# Patient Record
Sex: Male | Born: 1940 | Race: White | Hispanic: No | Marital: Married | Smoking: Former smoker
Health system: Southern US, Community
[De-identification: ages and names within clinical notes are randomized; demographics above are authoritative.]

## PROBLEM LIST (undated history)

## (undated) DIAGNOSIS — F329 Major depressive disorder, single episode, unspecified: Secondary | ICD-10-CM

## (undated) DIAGNOSIS — E785 Hyperlipidemia, unspecified: Secondary | ICD-10-CM

## (undated) DIAGNOSIS — M1990 Unspecified osteoarthritis, unspecified site: Secondary | ICD-10-CM

## (undated) DIAGNOSIS — I10 Essential (primary) hypertension: Secondary | ICD-10-CM

## (undated) DIAGNOSIS — C801 Malignant (primary) neoplasm, unspecified: Secondary | ICD-10-CM

## (undated) DIAGNOSIS — F419 Anxiety disorder, unspecified: Secondary | ICD-10-CM

## (undated) DIAGNOSIS — F32A Depression, unspecified: Secondary | ICD-10-CM

## (undated) DIAGNOSIS — I2699 Other pulmonary embolism without acute cor pulmonale: Secondary | ICD-10-CM

## (undated) HISTORY — DX: Anxiety disorder, unspecified: F41.9

## (undated) HISTORY — DX: Essential (primary) hypertension: I10

## (undated) HISTORY — DX: Unspecified osteoarthritis, unspecified site: M19.90

## (undated) HISTORY — PX: AXILLARY LYMPH NODE BIOPSY: SHX5737

## (undated) HISTORY — PX: HERNIA REPAIR: SHX51

## (undated) HISTORY — DX: Hyperlipidemia, unspecified: E78.5

## (undated) HISTORY — DX: Depression, unspecified: F32.A

## (undated) HISTORY — DX: Malignant (primary) neoplasm, unspecified: C80.1

## (undated) HISTORY — PX: PROSTATE SURGERY: SHX751

## (undated) HISTORY — PX: VASECTOMY: SHX75

## (undated) HISTORY — PX: MELANOMA EXCISION: SHX5266

## (undated) HISTORY — PX: APPENDECTOMY: SHX54

## (undated) HISTORY — DX: Major depressive disorder, single episode, unspecified: F32.9

## (undated) HISTORY — PX: SUPERFICIAL LYMPH NODE BIOPSY / EXCISION: SUR127

## (undated) HISTORY — PX: LUMBAR SPINE SURGERY: SHX701

## (undated) HISTORY — DX: Other pulmonary embolism without acute cor pulmonale: I26.99

---

## 2017-02-22 HISTORY — PX: VEIN SURGERY: SHX48

## 2017-12-30 ENCOUNTER — Encounter: Payer: Self-pay | Admitting: Neurology

## 2018-01-30 ENCOUNTER — Ambulatory Visit (INDEPENDENT_AMBULATORY_CARE_PROVIDER_SITE_OTHER): Payer: Medicare Other | Admitting: Physician Assistant

## 2018-01-30 ENCOUNTER — Encounter: Payer: Self-pay | Admitting: Physician Assistant

## 2018-01-30 ENCOUNTER — Ambulatory Visit (INDEPENDENT_AMBULATORY_CARE_PROVIDER_SITE_OTHER): Payer: Medicare Other

## 2018-01-30 VITALS — BP 145/85 | HR 66 | Temp 98.2°F | Ht 75.0 in | Wt 202.0 lb

## 2018-01-30 DIAGNOSIS — M25511 Pain in right shoulder: Secondary | ICD-10-CM | POA: Diagnosis not present

## 2018-01-30 DIAGNOSIS — C7A8 Other malignant neuroendocrine tumors: Secondary | ICD-10-CM

## 2018-01-30 DIAGNOSIS — G8929 Other chronic pain: Secondary | ICD-10-CM | POA: Diagnosis not present

## 2018-01-30 DIAGNOSIS — M47816 Spondylosis without myelopathy or radiculopathy, lumbar region: Secondary | ICD-10-CM | POA: Diagnosis not present

## 2018-01-30 DIAGNOSIS — F0280 Dementia in other diseases classified elsewhere without behavioral disturbance: Secondary | ICD-10-CM | POA: Insufficient documentation

## 2018-01-30 DIAGNOSIS — G20A1 Parkinson's disease without dyskinesia, without mention of fluctuations: Secondary | ICD-10-CM

## 2018-01-30 DIAGNOSIS — M25512 Pain in left shoulder: Secondary | ICD-10-CM

## 2018-01-30 DIAGNOSIS — G309 Alzheimer's disease, unspecified: Secondary | ICD-10-CM

## 2018-01-30 DIAGNOSIS — C7B8 Other secondary neuroendocrine tumors: Secondary | ICD-10-CM

## 2018-01-30 DIAGNOSIS — Z7689 Persons encountering health services in other specified circumstances: Secondary | ICD-10-CM

## 2018-01-30 DIAGNOSIS — Z111 Encounter for screening for respiratory tuberculosis: Secondary | ICD-10-CM

## 2018-01-30 DIAGNOSIS — M160 Bilateral primary osteoarthritis of hip: Secondary | ICD-10-CM | POA: Insufficient documentation

## 2018-01-30 DIAGNOSIS — G20 Parkinson's disease: Secondary | ICD-10-CM | POA: Insufficient documentation

## 2018-01-30 DIAGNOSIS — I83813 Varicose veins of bilateral lower extremities with pain: Secondary | ICD-10-CM | POA: Insufficient documentation

## 2018-01-30 DIAGNOSIS — M170 Bilateral primary osteoarthritis of knee: Secondary | ICD-10-CM | POA: Insufficient documentation

## 2018-01-30 DIAGNOSIS — Z0279 Encounter for issue of other medical certificate: Secondary | ICD-10-CM

## 2018-01-30 DIAGNOSIS — N182 Chronic kidney disease, stage 2 (mild): Secondary | ICD-10-CM

## 2018-01-30 DIAGNOSIS — G20C Parkinsonism, unspecified: Secondary | ICD-10-CM | POA: Insufficient documentation

## 2018-01-30 DIAGNOSIS — I10 Essential (primary) hypertension: Secondary | ICD-10-CM

## 2018-01-30 DIAGNOSIS — F331 Major depressive disorder, recurrent, moderate: Secondary | ICD-10-CM | POA: Diagnosis not present

## 2018-01-30 NOTE — Progress Notes (Signed)
Subjective:    CC: Multiple aches  HPI: This is a pleasant 77 year old male with a history of metastatic neuroendocrine cancer.  He is here to establish care with 1 of my partners, I am called to consult on multiple joint aches, the worst of which is his left knee.  Pain is moderate, persistent, localized to the medial joint line with radiation into the mid tibia, no mechanical symptoms, no trauma.  He also has pain in both hips, his lumbar spine (post multilevel laminectomy), both shoulders.  Have significant weakness in his legs.  Worsening. Per family report he also has a resting tremor occasionally in his hands.  I reviewed the past medical history, family history, social history, surgical history, and allergies today and no changes were needed.  Please see the problem list section below in epic for further details.  Past Medical History: Past Medical History:  Diagnosis Date  . Anxiety   . Arthritis   . Cancer ([place])   . Depression   . Hyperlipidemia   . Hypertension   . Pulmonary embolism [facility])    Past Surgical History: Past Surgical History:  Procedure Laterality Date  . APPENDECTOMY    . AXILLARY LYMPH NODE BIOPSY    . HERNIA REPAIR    . LUMBAR SPINE SURGERY    . MELANOMA EXCISION Left    left arm  . PROSTATE SURGERY    . SUPERFICIAL LYMPH NODE BIOPSY / EXCISION    . VASECTOMY    . VEIN SURGERY Bilateral 2019   Social History: Social History   Socioeconomic History  . Marital status: Married    Spouse name: Not on file  . Number of children: Not on file  . Years of education: Not on file  . Highest education level: Not on file  Occupational History  . Not on file  Social Needs  . Financial resource strain: Not on file  . Food insecurity:    Worry: Not on file    Inability: Not on file  . Transportation needs:    Medical: Not on file    Non-medical: Not on file  Tobacco Use  . Smoking status: Former Research scientist (life sciences)  . Smokeless tobacco: Never Used  Substance and  Sexual Activity  . Alcohol use: Not Currently  . Drug use: Never  . Sexual activity: Not Currently  Lifestyle  . Physical activity:    Days per week: Not on file    Minutes per session: Not on file  . Stress: Not on file  Relationships  . Social connections:    Talks on phone: Not on file    Gets together: Not on file    Attends religious service: Not on file    Active member of club or organization: Not on file    Attends meetings of clubs or organizations: Not on file    Relationship status: Not on file  Other Topics Concern  . Not on file  Social History Narrative  . Not on file   Family History: Family History  Family history unknown: Yes   Allergies: No Known Allergies Medications: See med rec.  Review of Systems: No fevers, chills, night sweats, weight loss, chest pain, or shortness of breath.   Objective:    General: Well Developed, well nourished, and in no acute distress.  Neuro: Alert and oriented x3, extra-ocular muscles intact, sensation grossly intact.  Shuffling gait, masklike face, he does not swing his arms when walking.  Per family report he also has a  resting tremor occasionally in his hands. HEENT: Normocephalic, atraumatic, pupils equal round reactive to light, neck supple, no masses, no lymphadenopathy, thyroid nonpalpable.  Skin: Warm and dry, no rashes. Cardiac: Regular rate and rhythm, no murmurs rubs or gallops, no lower extremity edema.  Respiratory: Clear to auscultation bilaterally. Not using accessory muscles, speaking in full sentences. Left knee: Normal to inspection with no erythema or effusion or obvious bony abnormalities. Tender to palpation of the medial joint line ROM normal in flexion and extension and lower leg rotation. Ligaments with solid consistent endpoints including ACL, PCL, LCL, MCL. Negative Mcmurray's and provocative meniscal tests. Non painful patellar compression. Patellar and quadriceps tendons unremarkable. Hamstring  and quadriceps strength is normal.  Procedure: Real-time Ultrasound Guided Injection of left knee Device: GE Logiq E  Verbal informed consent obtained.  Time-out conducted.  Noted no overlying erythema, induration, or other signs of local infection.  Skin prepped in a sterile fashion.  Local anesthesia: Topical Ethyl chloride.  With sterile technique and under real time ultrasound guidance: 1 cc Kenalog 40, 2 cc lidocaine, 2 cc bupivacaine injected easily. Completed without difficulty  Pain immediately resolved suggesting accurate placement of the medication.  Advised to call if fevers/chills, erythema, induration, drainage, or persistent bleeding.  Images permanently stored and available for review in the ultrasound unit.  Impression: Technically successful ultrasound guided injection.  Impression and Recommendations:    Lumbar spondylosis With progressive weakness, history of multilevel lumbar laminectomy. Thoracic and lumbar spine x-rays. Lumbar spine MRI without contrast for now. He does have a history of metastatic neuroendocrine cancer.  Bilateral shoulder pain Bilateral x-rays. We will further evaluate this at a future visit.  Primary osteoarthritis of both knees Bilateral knee x-rays. Left knee injection as above, return in a month.  Primary osteoarthritis of both hips X-rays today, I do expect some systemic effect from the knee injection. We will target 1 joint at a time.  Parkinsonism ([place]) Classic Parkinson's gait, masklike face, lack of swinging arms, shuffling gait. Referral to neurology.  I spent 40 minutes with this patient, greater than 50% was face-to-face time counseling regarding the above diagnoses, specifically coordinating the multiple aspects of his care for his multiple orthopedic diagnoses and neurologic diagnoses ___________________________________________ Gwen Her. Dianah Field, M.D., ABFM., CAQSM. Primary Care and Sports Medicine [place]  [facility]  Adjunct Professor of [place] [employer]

## 2018-01-30 NOTE — Assessment & Plan Note (Signed)
Bilateral knee x-rays. Left knee injection as above, return in a month.

## 2018-01-30 NOTE — Assessment & Plan Note (Signed)
With progressive weakness, history of multilevel lumbar laminectomy. Thoracic and lumbar spine x-rays. Lumbar spine MRI without contrast for now. He does have a history of metastatic neuroendocrine cancer.

## 2018-01-30 NOTE — Progress Notes (Signed)
HPI:                                                                Andre Sharp is a 77 y.o. male who presents to [facility]: Primary Care Sports Medicine today to establish care  Current concerns:   He is transferring his care from [facility]. History is provided by the patient, his son and his daughter-in-law.  He is currently residing with son and in-laws, who are requesting FL2 form for placement to a memory care center.   Alzheimer's disease: recently evaluated at [facility] ED on 01/29/18 for AMS. CT head was negative for acute intracranial abnormality; showed chronic small vessel disease and mild atrophy. CXR, Ammonia, CMP and CBC were unremarkable. He was treated empirically for acute cystitis with Keflex; urine cx pending. Family reports he is back to baseline. He is oriented to person, place, time. Tends to wander and becomes a little agitated at times. Family is requesting placement to memory care. Currently taking Donepezil 5 mg daily. He also takes Trazodone 50 mg and Lexapro 10 mg for depression/sleep difficulties.  Neuroendocrine tumor: he was followed by Dr. Lily Peer at [facility]. Family has copies of some records and imaging studies showing metastatic disease to the lung, liver, lymph nodes and thoracic spine. Requesting referral to hem/onc.  History of DVT/PE:  Reports he was originally on Eliquis twice per day and is now only taking it once per day. Family reports they give him Ibuprofen every once in awhile for headache  His other main concern is bilateral lower extremity pain. Reports pain in his thighs and medial calves, pain is constant, moderate, persistent. He has had varicose vein surgery in [place] (Dr. Elnoria Howard, vascular [place]) in the recent past, but states this was not helpful in alleviating his pain. He currently takes Tylenol without relief.   Depression screen PHQ 2/9 01/30/2018  Decreased Interest 2  Down, Depressed,  Hopeless 2  PHQ - 2 Score 4  Altered sleeping 3  Tired, decreased energy 1  Change in appetite 0  Feeling bad or failure about yourself  1  Trouble concentrating 3  Moving slowly or fidgety/restless 2  Suicidal thoughts 0  PHQ-9 Score 14  Difficult doing work/chores Not difficult at all    GAD 7 : Generalized Anxiety Score 01/30/2018  Nervous, Anxious, on Edge 1  Control/stop worrying 0  Worry too much - different things 0  Trouble relaxing 2  Restless 3  Easily annoyed or irritable 1  Afraid - awful might happen 0  Total GAD 7 Score 7  Anxiety Difficulty Not difficult at all    Fall Risk  01/30/2018  Falls in the past year? 1  Number falls in past yr: 1  Injury with Fall? 1  Risk for fall due to : History of fall(s);Impaired balance/gait;Mental status change  Follow up Falls prevention discussed   No flowsheet data found.   Past Medical History:  Diagnosis Date  . Anxiety   . Arthritis   . Cancer ([place])   . Depression   . Hyperlipidemia   . Hypertension   . Pulmonary embolism [facility])    Past Surgical History:  Procedure Laterality Date  . APPENDECTOMY    . AXILLARY  LYMPH NODE BIOPSY    . HERNIA REPAIR    . LUMBAR SPINE SURGERY    . MELANOMA EXCISION Left    left arm  . PROSTATE SURGERY    . SUPERFICIAL LYMPH NODE BIOPSY / EXCISION    . VASECTOMY    . VEIN SURGERY Bilateral 2019   Social History   Tobacco Use  . Smoking status: Former Research scientist (life sciences)  . Smokeless tobacco: Never Used  Substance Use Topics  . Alcohol use: Not Currently   Family history is unknown by patient.    ROS: Review of Systems  Constitutional: Positive for fever.  Respiratory: Positive for cough.   Genitourinary:       + recurrent UTI  Musculoskeletal: Positive for back pain, falls, joint pain and myalgias.  Neurological: Positive for weakness and headaches.  Endo/Heme/Allergies: Bruises/bleeds easily.     Medications: Current Outpatient Medications  Medication Sig Dispense  Refill  . acetaminophen (TYLENOL) 500 MG tablet Take 500 mg by mouth every 6 (six) hours as needed for headache.    . Apoaequorin 10 MG CAPS Take by mouth.    . cephALEXin (KEFLEX) 500 MG capsule Take 500 mg by mouth 2 (two) times daily.    . diclofenac sodium (VOLTAREN) 1 % GEL Apply 1 application topically 2 (two) times daily as needed.    . docusate sodium (COLACE) 100 MG capsule Take 100 mg by mouth daily as needed for constipation.    . Inulin (FIBER CHOICE PO) Take by mouth.    . metoprolol tartrate (LOPRESSOR) 25 MG tablet Take 25 mg by mouth 2 (two) times daily.    Marland Kitchen octreotide (SANDOSTATIN LAR) 10 MG injection Inject 10 mg into the muscle every 28 (twenty-eight) days.    . Probiotic Product (PROBIOTIC-10) CAPS Take by mouth.    . Vitamins C E (CRANBERRY URINARY COMFORT PO) Take by mouth.    Marland Kitchen amLODipine (NORVASC) 5 MG tablet Take 5 mg by mouth daily.    Marland Kitchen apixaban (ELIQUIS) 5 MG TABS tablet Take 1 tablet (5 mg total) by mouth daily. 90 tablet 0  . diazepam (VALIUM) 5 MG tablet 1 tab 2 hours before procedure or imaging, take another tab 30 minutes to 1 hour before if not feeling relaxed 2 tablet 0  . donepezil (ARICEPT) 5 MG tablet Take 5 mg by mouth at bedtime.    Marland Kitchen escitalopram (LEXAPRO) 10 MG tablet Take 10 mg by mouth daily.    Marland Kitchen ibuprofen (ADVIL,MOTRIN) 600 MG tablet Take 600 mg by mouth every 8 (eight) hours as needed.    Marland Kitchen lisinopril (PRINIVIL,ZESTRIL) 20 MG tablet Take 20 mg by mouth daily.    . Melatonin 1 MG SUBL Place 1 mg under the tongue at bedtime as needed.    Marland Kitchen omeprazole (PRILOSEC) 20 MG capsule Take 20 mg by mouth daily.    . traZODone (DESYREL) 50 MG tablet Take 50 mg by mouth at bedtime as needed.    . trimethoprim (TRIMPEX) 100 MG tablet Take 100 mg by mouth daily.     No current facility-administered medications for this visit.    No Known Allergies     Objective:  BP (!) 145/85   Pulse 66   Temp 98.2 F (36.8 C) (Oral)   Ht 6\' 3"  (1.905 m)   Wt 202  lb (91.6 kg)   SpO2 97%   BMI 25.25 kg/m  Gen:  alert, not ill-appearing, no distress, appropriate for age HEENT: head normocephalic without obvious abnormality, conjunctiva  and cornea clear, trachea midline Pulm: Normal work of breathing, normal phonation, clear to auscultation bilaterally, no wheezes, rales or rhonchi CV: Normal rate, regular rhythm, s1 and s2 distinct, no murmurs, clicks or rubs  Neuro: alert and oriented x 3, unable to provide history regarding his visit to the ED yesterday, no tremor MSK: extremities atraumatic, slow, shuffling gait and station Skin: intact, no rashes on exposed skin, no jaundice, no cyanosis, varicosities of bilateral lower extremities Psych: well-groomed, cooperative, good eye contact, depressed mood, flat affect is mood-congruent, speech is articulate  Acute Interface, Incoming Rad Results - 01/29/2018  9:16 PM EST  INDICATION:AMS  TECHNIQUE: CT HEAD WITHOUT IV CONTRAST  FINDINGS:   Study is limited due to motion artifact.  There is no midline shift, intracranial hemorrhage, or acute mass effect. No abnormal extra-axial fluid collections are seen. The ventricles, cisterns, and sulci are mildly enlarged.  There is mild to moderate  atrophy of the bilateral cerebellar hemispheres.  Negative for hydrocephalus.  Moderate periventricular deep white matter hypodensity is identified.  The visualized orbits are unremarkable. The visualized paranasal sinuses are clear. The mastoid air cells are clear.  No displaced or depressed calvarial fracture is identified.   IMPRESSION:   1.  No acute intracranial abnormality is identified. 2.  Mild cerebral atrophy. 3.  Moderate small vessel ischemic disease changes.  Electronically Signed by: [place] Everywhere 01/29/2018 WBC 8.4 HGB 14.8 Plt 208  Glucose 136 Na 138  K 4.1 BUN 19 Scr 1.01 GFR 83    Assessment and Plan: 77 y.o. male with   .[place] was seen today for  establish care.  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 [facility]) -     Ambulatory referral to Hematology / Oncology  Varicose veins of bilateral lower extremities with pain  Encounter for PPD test -     TB Skin Test  Encounter for issuance of medical certificate  Primary osteoarthritis of both knees -     DG Knee Complete 4 Views Left; Future -     DG Knee Complete 4 Views Right; Future  Chronic pain of both shoulders -     DG Shoulder Left; Future -     DG Shoulder Right; Future  Lumbar spondylosis -     DG Thoracic Spine W/Swimmers -     DG Lumbar Spine Complete; Future -     MR LUMBAR SPINE WO CONTRAST; Future  Primary osteoarthritis of both hips -     DG HIP UNILAT W OR W/O PELVIS 2-3 VIEWS LEFT -     DG HIP UNILAT W OR W/O PELVIS 2-3 VIEWS RIGHT   - Personally reviewed PMH, PSH, PFH, medications, allergies, HM - Personally reviewed labs and imaging in Care Everywhere from 01/29/18, findings noted above - Influenza UTD - Tdap deferred - Pneumococcal unknown - PHQ2 positive, PHQ9=14, no acute safety issues, cont current medications - FL2 form completed for placement to memory care  - PPD test placed today, return in 48 hours for PPD read - Sports Medicine was consulted today for lower extremity pain (see A&P note) - During consultation, there was concern for possible underlying Parkinson's disease. Patient was referred to Neurology   Patient  education and anticipatory guidance given Patient agrees with treatment plan Follow-up in 2 days for PPD read as needed if symptoms worsen or fail to improve  Darlyne Russian PA-C

## 2018-01-30 NOTE — Assessment & Plan Note (Signed)
X-rays today, I do expect some systemic effect from the knee injection. We will target 1 joint at a time.

## 2018-01-30 NOTE — Assessment & Plan Note (Signed)
Classic Parkinson's gait, masklike face, lack of swinging arms, shuffling gait. Referral to neurology.

## 2018-01-30 NOTE — Assessment & Plan Note (Signed)
Bilateral x-rays. We will further evaluate this at a future visit.

## 2018-01-31 ENCOUNTER — Telehealth: Payer: Self-pay | Admitting: Sports Medicine

## 2018-01-31 ENCOUNTER — Telehealth: Payer: Self-pay

## 2018-01-31 ENCOUNTER — Encounter: Payer: Self-pay | Admitting: Physician Assistant

## 2018-01-31 DIAGNOSIS — F331 Major depressive disorder, recurrent, moderate: Secondary | ICD-10-CM | POA: Insufficient documentation

## 2018-01-31 DIAGNOSIS — Z85820 Personal history of malignant melanoma of skin: Secondary | ICD-10-CM | POA: Insufficient documentation

## 2018-01-31 DIAGNOSIS — Z8546 Personal history of malignant neoplasm of prostate: Secondary | ICD-10-CM | POA: Insufficient documentation

## 2018-01-31 MED ORDER — DIAZEPAM 5 MG PO TABS: ORAL_TABLET | ORAL | 0 refills | Status: DC

## 2018-01-31 NOTE — Telephone Encounter (Signed)
Pt needs premeds prior to MRI for claustrophobia. Routing.

## 2018-01-31 NOTE — Telephone Encounter (Signed)
Pt son left a vm asking for a letter stated that his father is not competent enough to make his own decisions.  He is wanting to meet with some lawyers and they are needing this before they schedule an appointment with him. -EH/RMA

## 2018-01-31 NOTE — Telephone Encounter (Signed)
[facility] called in

## 2018-01-31 NOTE — Telephone Encounter (Signed)
Pt's son advised.

## 2018-02-01 ENCOUNTER — Encounter: Payer: Self-pay | Admitting: Physician Assistant

## 2018-02-01 ENCOUNTER — Ambulatory Visit (INDEPENDENT_AMBULATORY_CARE_PROVIDER_SITE_OTHER): Payer: Medicare Other | Admitting: Physician Assistant

## 2018-02-01 ENCOUNTER — Other Ambulatory Visit: Payer: Self-pay

## 2018-02-01 VITALS — Temp 97.9°F

## 2018-02-01 DIAGNOSIS — Z111 Encounter for screening for respiratory tuberculosis: Secondary | ICD-10-CM

## 2018-02-01 LAB — TB SKIN TEST
INDURATION: 0 mm
TB Skin Test: NEGATIVE

## 2018-02-01 MED ORDER — APIXABAN 5 MG PO TABS: 5.0000 mg | ORAL_TABLET | Freq: Every day | ORAL | 0 refills | Status: AC

## 2018-02-01 NOTE — Telephone Encounter (Signed)
Signed letter in your inbox along with FL2 form. Please just fill in prescription medications to the [facility]

## 2018-02-01 NOTE — Progress Notes (Signed)
  Oncology Nurse Navigator Documentation  Called patient's daughter-in-law  to advise of initial consult on 02/06/18 @ 2 PM with Dr. Benay Spice.

## 2018-02-01 NOTE — Progress Notes (Signed)
Pt came into clinic for PPD read. Did have some bruising at injection site, no induration. Negative results entered and printed. Results provided to Pt's family. No further questions.

## 2018-02-01 NOTE — Telephone Encounter (Signed)
Letter given to son during his visit today -EH/RMA

## 2018-02-05 ENCOUNTER — Encounter: Payer: Self-pay | Admitting: Physician Assistant

## 2018-02-05 DIAGNOSIS — I10 Essential (primary) hypertension: Secondary | ICD-10-CM | POA: Insufficient documentation

## 2018-02-05 DIAGNOSIS — N182 Chronic kidney disease, stage 2 (mild): Secondary | ICD-10-CM | POA: Insufficient documentation

## 2018-02-06 ENCOUNTER — Ambulatory Visit (INDEPENDENT_AMBULATORY_CARE_PROVIDER_SITE_OTHER): Payer: Medicare Other

## 2018-02-06 ENCOUNTER — Ambulatory Visit: Payer: Medicare Other | Admitting: Oncology

## 2018-02-06 DIAGNOSIS — M47816 Spondylosis without myelopathy or radiculopathy, lumbar region: Secondary | ICD-10-CM | POA: Diagnosis not present

## 2018-02-07 ENCOUNTER — Other Ambulatory Visit: Payer: Self-pay | Admitting: Physician Assistant

## 2018-02-07 ENCOUNTER — Other Ambulatory Visit: Payer: Self-pay

## 2018-02-07 ENCOUNTER — Telehealth: Payer: Self-pay | Admitting: Physician Assistant

## 2018-02-07 MED ORDER — AMBULATORY NON FORMULARY MEDICATION: 0 refills | Status: DC

## 2018-02-07 MED ORDER — CYANOCOBALAMIN 2500 MCG PO CHEW: 5000.0000 ug | CHEWABLE_TABLET | Freq: Every day | ORAL | 3 refills | Status: AC

## 2018-02-07 MED ORDER — PHENAZOPYRIDINE HCL 95 MG PO TABS: 95.0000 mg | ORAL_TABLET | Freq: Three times a day (TID) | ORAL | 2 refills | Status: AC | PRN

## 2018-02-07 MED ORDER — ACETAMINOPHEN 500 MG PO TABS: 500.0000 mg | ORAL_TABLET | Freq: Four times a day (QID) | ORAL | Status: AC | PRN

## 2018-02-07 MED ORDER — AMBULATORY NON FORMULARY MEDICATION: 0 refills | Status: AC

## 2018-02-07 MED ORDER — MELATONIN 1 MG SL SUBL: 5.0000 mg | SUBLINGUAL_TABLET | Freq: Every evening | SUBLINGUAL | Status: AC | PRN

## 2018-02-07 MED ORDER — GLYCERIN (ADULT) 2 G RE SUPP: 1.0000 | RECTAL | 0 refills | Status: AC | PRN

## 2018-02-07 MED ORDER — APOAEQUORIN 20 MG PO CAPS: 20.0000 mg | ORAL_CAPSULE | Freq: Every day | ORAL | Status: AC

## 2018-02-07 MED ORDER — TRIMETHOPRIM 100 MG PO TABS: 100.0000 mg | ORAL_TABLET | Freq: Every day | ORAL | 3 refills | Status: AC

## 2018-02-07 NOTE — Telephone Encounter (Signed)
Andre Sharp spoke with daughter in-law

## 2018-02-07 NOTE — Telephone Encounter (Signed)
Received fax from [facility] I signed all of the standing orders They are asking for medication clarification It is unclear if they are stating they want the patient to d/c these meds or if they are meds he reports no longer taking. Can you perform the med rec and update both our EMR and their form? Specifically they are questioning the following Trimethoprim (antibiotic - this may have something to do with hx of prostate cancer or recurrent UTI) Octreotide (cancer medication that will be administered at the cancer center, keep on our list)  Please fax once med rec is complete

## 2018-02-27 ENCOUNTER — Ambulatory Visit: Payer: Self-pay | Admitting: Sports Medicine

## 2018-03-02 ENCOUNTER — Ambulatory Visit (INDEPENDENT_AMBULATORY_CARE_PROVIDER_SITE_OTHER): Payer: Medicare Other | Admitting: Sports Medicine

## 2018-03-02 ENCOUNTER — Encounter: Payer: Self-pay | Admitting: Sports Medicine

## 2018-03-02 DIAGNOSIS — M47816 Spondylosis without myelopathy or radiculopathy, lumbar region: Secondary | ICD-10-CM | POA: Diagnosis not present

## 2018-03-02 MED ORDER — TRAMADOL HCL 50 MG PO TABS: 50.0000 mg | ORAL_TABLET | Freq: Three times a day (TID) | ORAL | 0 refills | Status: DC | PRN

## 2018-03-02 MED ORDER — GABAPENTIN 300 MG PO CAPS: ORAL_CAPSULE | ORAL | 3 refills | Status: DC

## 2018-03-02 MED ORDER — GABAPENTIN 300 MG PO CAPS: ORAL_CAPSULE | ORAL | 3 refills | Status: AC

## 2018-03-02 MED ORDER — TRAMADOL HCL 50 MG PO TABS: 50.0000 mg | ORAL_TABLET | Freq: Three times a day (TID) | ORAL | 0 refills | Status: AC | PRN

## 2018-03-02 NOTE — Progress Notes (Signed)
Subjective:    CC: Recheck multiple aches and pains  HPI: [place] is a pleasant 78 year old male, we treated him for knee osteoarthritis at the last visit, we injected his knee, he may have had some improvement but it is tough to tell as he has difficulty remembering things.  He has had somewhat of a decrease in his mentation recently.  He has not yet seen his PCP for this.  He does have significant back pain, has had a multilevel lumbar decompression, hip arthritis, knee arthritis and shoulder osteoarthritis in multiple locations.  They understand that rather than injecting multiple structures it might be the best to try some medications.  His back pain is axial, with radiation down into the back of both legs.  No bowel or bladder dysfunction, saddle numbness, no constitutional signs.  I reviewed the past medical history, family history, social history, surgical history, and allergies today and no changes were needed.  Please see the problem list section below in epic for further details.  Past Medical History: Past Medical History:  Diagnosis Date  . Anxiety   . Arthritis   . Cancer ([place])   . Depression   . Hyperlipidemia   . Hypertension   . Pulmonary embolism [facility])    Past Surgical History: Past Surgical History:  Procedure Laterality Date  . APPENDECTOMY    . AXILLARY LYMPH NODE BIOPSY    . HERNIA REPAIR    . LUMBAR SPINE SURGERY    . MELANOMA EXCISION Left    left arm  . PROSTATE SURGERY    . SUPERFICIAL LYMPH NODE BIOPSY / EXCISION    . VASECTOMY    . VEIN SURGERY Bilateral 2019   Social History: Social History   Socioeconomic History  . Marital status: Married    Spouse name: Not on file  . Number of children: Not on file  . Years of education: Not on file  . Highest education level: Not on file  Occupational History  . Not on file  Social Needs  . Financial resource strain: Not on file  . Food insecurity:    Worry: Not on file    Inability: Not on file  .  Transportation needs:    Medical: Not on file    Non-medical: Not on file  Tobacco Use  . Smoking status: Former Research scientist (life sciences)  . Smokeless tobacco: Never Used  Substance and Sexual Activity  . Alcohol use: Not Currently  . Drug use: Never  . Sexual activity: Not Currently  Lifestyle  . Physical activity:    Days per week: Not on file    Minutes per session: Not on file  . Stress: Not on file  Relationships  . Social connections:    Talks on phone: Not on file    Gets together: Not on file    Attends religious service: Not on file    Active member of club or organization: Not on file    Attends meetings of clubs or organizations: Not on file    Relationship status: Not on file  Other Topics Concern  . Not on file  Social History Narrative  . Not on file   Family History: Family History  Family history unknown: Yes   Allergies: No Known Allergies Medications: See med rec.  Review of Systems: No fevers, chills, night sweats, weight loss, chest pain, or shortness of breath.   Objective:    General: Well Developed, well nourished, and in no acute distress.  Neuro: Alert and oriented  x3, extra-ocular muscles intact, sensation grossly intact.  HEENT: Normocephalic, atraumatic, pupils equal round reactive to light, neck supple, no masses, no lymphadenopathy, thyroid nonpalpable.  Skin: Warm and dry, no rashes. Cardiac: Regular rate and rhythm, no murmurs rubs or gallops, no lower extremity edema.  Respiratory: Clear to auscultation bilaterally. Not using accessory muscles, speaking in full sentences.  Impression and Recommendations:    Lumbar spondylosis [place] does have significant facet arthritis in his spine, no discrete areas of neuroforaminal stenosis on the MRI. Knee is still hurting, his shoulders and hips also still hurt. We are going to start medical pain management. He has not responded to epidurals in the past. No NSAIDs, on Eliquis. Gabapentin in a slow up taper,  tramadol for breakthrough pain. Home health physical therapy at his assisted living facility, return to see me as needed.  ___________________________________________ Gwen Her. Dianah Field, M.D., ABFM., CAQSM. Primary Care and Sports Medicine [place] [facility]  Adjunct Professor of [place] [employer]

## 2018-03-02 NOTE — Assessment & Plan Note (Signed)
Andre Sharp does have significant facet arthritis in his spine, no discrete areas of neuroforaminal stenosis on the MRI. Knee is still hurting, his shoulders and hips also still hurt. We are going to start medical pain management. He has not responded to epidurals in the past. No NSAIDs, on Eliquis. Gabapentin in a slow up taper, tramadol for breakthrough pain. Home health physical therapy at his assisted living facility, return to see me as needed.

## 2018-03-17 ENCOUNTER — Telehealth: Payer: Self-pay

## 2018-03-17 NOTE — Telephone Encounter (Signed)
Left message for a return call

## 2018-03-17 NOTE — Telephone Encounter (Signed)
Brittney with Home Health called and states she needed to report out of range vitals.   Neck pain 8-9-and 10 at times Pulse 109

## 2018-03-17 NOTE — Telephone Encounter (Signed)
Noted.  Recommend Ibuprofen 600 mg every 6 hours as needed for pain Can also take Tramadol 50 mg every 6 hours as needed for severe pain Continue Gabapentin titration Apply ice or heat (whichever feels better) for 20-30 mins every 3-4 hours as needed Keep follow-up with Dr. Darene Lamer Follow-up sooner for upper extremity weakness, new numbness/tingling, fever/chills, worsening symptoms

## 2018-03-20 ENCOUNTER — Encounter: Payer: Self-pay | Admitting: Neurology

## 2018-03-20 ENCOUNTER — Ambulatory Visit (INDEPENDENT_AMBULATORY_CARE_PROVIDER_SITE_OTHER): Payer: Medicare Other | Admitting: Neurology

## 2018-03-20 VITALS — BP 137/72 | HR 108

## 2018-03-20 DIAGNOSIS — Z7409 Other reduced mobility: Secondary | ICD-10-CM | POA: Diagnosis not present

## 2018-03-20 DIAGNOSIS — R4182 Altered mental status, unspecified: Secondary | ICD-10-CM

## 2018-03-20 NOTE — Progress Notes (Signed)
Subjective:    Patient ID: Andre Sharp is a 78 y.o. male.  HPI     Andre Age, MD, PhD [facility] [address] [place], [place] [address]  Dear Dr. Dianah Field,   I saw your patient, Andre Sharp, upon your kind request in my neurologic clinic today for initial consultation for concern for parkinsonism. The Patient is accompanied by his son and daughter-in-law today. As you know, Andre Sharp is a 78 year old right-handed gentleman with an underlying medical history of arthritis affecting multiple major joint areas of lower back, history of cancer, anxiety, depression, hypertension, hyperlipidemia, pulmonary embolism, neuroendocrine tumors/carcinoid, and memory loss, who reports very little today. He is essentially nonverbal and unable to answer any questions appropriately. His daughter-in-law provides most of the history. He moved from [place] about a month ago. He is currently in a memory care facility in [place], [place]. According to the son and daughter-in-law he was able to walk fairly independently up until about 3-1/2 months ago. He was also driving at the time but he was not doing very well in terms of nutrition and dated today care.  I reviewed your office note from 03/02/2018. Of note, he had a brain MRI without contrast at an outside facility in [place] through his oncologist on 03/01/2017 and I reviewed the scanned report: Impression: Stable examination. No evidence for acute hemorrhage, acute infarction or hydrocephalus. No acute intracranial abnormalities. Sharp-appropriate involutional changes. Of note, he was hospitalized at [facility] in December 2019. He had a head CT without contrast on 02/10/2018 and I reviewed the results: No acute traumatic intracranial abnormality.     2.  Foci of Sharp-indeterminate lacunar infarcts within the bilateral basal ganglia and right frontal lobe corona radiata/centrum semiovale.  MRI could further assess for acute infarct if clinically indicated. 3.  Background of presumed advanced chronic microvascular ischemic changes in the cerebral white matter. He had a head CT without contrast on 02/13/2018 and I reviewed the results: 1. No acute intracranial normality. 2. Prior bilateral basal ganglia perforator infarcts. 3. Background of chronic ischemic microvascular disease.    He had a brain MRI without contrast on 12/30/2017 and I reviewed the results. Impression: Stable exam compared to 03/01/2017 with supratentorial T2 flair hyperintensities which are nonspecific but most commonly related to chronic microvascular ischemic angiopathy. Limited examination for intracranial metastatic disease on noncontrast exam. There is no new or suspicious T2 flair hyperintense signal compared to prior. He apparently also had a PET/CT in May 2019 but the results are not available for me today.  He has had some evidence of hallucinations and delusions, he has been picking at his skin, he has been treated for a recent urinary tract infection.  His Past Medical History Is Significant For: Past Medical History:  Diagnosis Date  . Anxiety   . Arthritis   . Cancer ([place])   . Depression   . Hyperlipidemia   . Hypertension   . Pulmonary embolism ([place])     His Past Surgical History Is Significant For: Past Surgical History:  Procedure Laterality Date  . APPENDECTOMY    . AXILLARY LYMPH NODE BIOPSY    . HERNIA REPAIR    . LUMBAR SPINE SURGERY    . MELANOMA EXCISION Left    left arm  . PROSTATE SURGERY    . SUPERFICIAL LYMPH NODE BIOPSY / EXCISION    . VASECTOMY    . VEIN SURGERY Bilateral 2019  His Family History Is Significant For: Family History  Family history unknown: Yes    His Social History Is Significant For: Social History   Socioeconomic History  . Marital status: Married    Spouse name: Not on file  . Number of children: Not on file  . Years of education: Not on  file  . Highest education level: Not on file  Occupational History  . Not on file  Social Needs  . Financial resource strain: Not on file  . Food insecurity:    Worry: Not on file    Inability: Not on file  . Transportation needs:    Medical: Not on file    Non-medical: Not on file  Tobacco Use  . Smoking status: Former Research scientist (life sciences)  . Smokeless tobacco: Never Used  Substance and Sexual Activity  . Alcohol use: Not Currently  . Drug use: Never  . Sexual activity: Not Currently  Lifestyle  . Physical activity:    Days per week: Not on file    Minutes per session: Not on file  . Stress: Not on file  Relationships  . Social connections:    Talks on phone: Not on file    Gets together: Not on file    Attends religious service: Not on file    Active member of club or organization: Not on file    Attends meetings of clubs or organizations: Not on file    Relationship status: Not on file  Other Topics Concern  . Not on file  Social History Narrative   Pt is living at [facility] in [place], [place]. [place]: [phone] F: [phone]    His Allergies Are:  No Known Allergies:   His Current Medications Are:  Outpatient Encounter Medications as of 03/20/2018  Medication Sig  . AMBULATORY NON FORMULARY MEDICATION Knee-high graduated compression stockings, apply to lower extremities daily  . amLODipine (NORVASC) 5 MG tablet Take 5 mg by mouth daily.  Marland Kitchen apixaban (ELIQUIS) 5 MG TABS tablet Take 1 tablet (5 mg total) by mouth daily.  Marland Kitchen Apoaequorin (PREVAGEN EXTRA STRENGTH) 20 MG CAPS Take 20 mg by mouth daily.  Marland Kitchen docusate sodium (COLACE) 100 MG capsule Take 100 mg by mouth daily as needed for constipation.  Marland Kitchen donepezil (ARICEPT) 5 MG tablet Take 5 mg by mouth at bedtime.  Marland Kitchen escitalopram (LEXAPRO) 10 MG tablet Take 10 mg by mouth daily.  Marland Kitchen lisinopril (PRINIVIL,ZESTRIL) 20 MG tablet Take 20 mg by mouth daily.  . Melatonin 1 MG SUBL Place 5 mg under the tongue at bedtime as needed.  Marland Kitchen  omeprazole (PRILOSEC) 20 MG capsule Take 20 mg by mouth daily.  . phenazopyridine (AZO-TABS) 95 MG tablet Take 1 tablet (95 mg total) by mouth 3 (three) times daily as needed for pain (dysuria).  . Probiotic Product (PROBIOTIC-10) CAPS Take by mouth.  . traZODone (DESYREL) 50 MG tablet Take 50 mg by mouth at bedtime as needed.  . trimethoprim (TRIMPEX) 100 MG tablet Take 1 tablet (100 mg total) by mouth daily.  Marland Kitchen acetaminophen (TYLENOL) 500 MG tablet Take 1-2 tablets (500-1,000 mg total) by mouth every 6 (six) hours as needed for moderate pain or headache.  . Cyanocobalamin 2500 MCG CHEW Chew 5,000 mcg by mouth daily.  . diclofenac sodium (VOLTAREN) 1 % GEL Apply 1 application topically 2 (two) times daily as needed.  . gabapentin (NEURONTIN) 300 MG capsule One tab PO qHS for a week, then BID for a week, then TID. May  double weekly to a max of 3,600mg /day (Patient not taking: Reported on 03/20/2018)  . glycerin adult 2 g suppository Place 1 suppository rectally as needed for constipation.  Marland Kitchen ibuprofen (ADVIL,MOTRIN) 600 MG tablet Take 600 mg by mouth every 8 (eight) hours as needed.  . metoprolol tartrate (LOPRESSOR) 25 MG tablet Take 25 mg by mouth 2 (two) times daily.  . traMADol (ULTRAM) 50 MG tablet Take 1 tablet (50 mg total) by mouth every 8 (eight) hours as needed for moderate pain. Maximum 6 tabs per day.   No facility-administered encounter medications on file as of 03/20/2018.   :   Review of Systems:  Out of a complete 14 point review of systems, all are reviewed and negative with the exception of these symptoms as listed below:  Review of Systems  Neurological:       Pt presents today to discuss possible PD versus dementia. Pt's family reports a rapid decline in pt's functional and mental status over the past 3-4 months. Pt's family reports that he was able to drive 3.5 months ago. Now, pt cannot feed himself nor stand. Pt is currently being treated for a UTI. MMSE attempted but pt  was unable to answer my questions.    Objective:  Neurological Exam  Physical Exam Physical Examination:   Vitals:   03/20/18 1001  BP: 137/72  Pulse: (!) 108   General Examination: The patient is a very 78 y.o. male in no acute distress. He appears frail and deconditioned. He is situated in his wheelchair, he is stooped/slumped over, does not pick up his head very well, is essentially nonverbal, is unable to answer questions appropriately. He continually picks at his scabs on his arms.  HEENT: Normocephalic, atraumatic, pupils are reactive to light, he is not able to track. He has no oral facial dyskinesias, tongue protrudes centrally but otherwise more sophisticated exam is not possible. Hearing seems intact or mildly impaired. Neck is moderately rigid and neck is forward tilted.   Chest: Clear to auscultation without wheezing, rhonchi or crackles noted.  Heart: S1+S2+0, regular and normal without murmurs, rubs or gallops noted.   Abdomen: Soft, non-tender and non-distended.  Extremities: There is no obvious edema, is wearing slippers assistant hospital socks.  Skin: Dry and scabs, partially bleeding due to picking.   Musculoskeletal: exam reveals no obvious joint deformities, tenderness or joint swelling or erythema.   Neurologically:  Mental status: The patient is awake, not paying attention, situated in his wheelchair, fidgeting, mostly picking at his skin. His memory, attention, language and fund of knowledge are abnormal. Cranial nerves II - XII are as described above under HEENT exam.  Motor exam: Thin bulk, global strength of 4 out of 5, no one-sided weakness noted. No resting tremor noted. On fine motor skills with limited examination only possible, no decrement in amplitude noted with hand movements, finger taps or more sophisticated movements are not able to be performed by the patient. Tone is increased in the neck area but not telltale in his extremities, reflexes are  about 1+, absent in the ankles.   Cerebellar testing: Not further possible.   Sensory exam: seems intact to light touch.  Gait, station and balance: He is unable to stand or walk for me.  Assessment and Plan:   In summary, Andre Sharp is a very pleasant 78 y.o.-year old male with an underlying medical history of arthritis affecting multiple major joint areas of lower back, history of cancer, anxiety, depression, hypertension, hyperlipidemia,  pulmonary embolism, neuroendocrine tumors/carcinoid, and memory loss, who Presents for evaluation of parkinsonism. What limited examination was possible today does not point towards classic idiopathic Parkinson's disease. He does not have much in the way of parkinsonism on examination. Whether or not he has Lewy body dementia is difficult to tell but from his recent imaging studies does not have much in the way of advanced for Sharp atrophy. He does have a history of recent urinary tract infection which may cause some of altered mental status today. He appears to have had a rapid decline in his motor and cognitive functioning. I am unclear as to the cause of this. He was previously followed by oncology for multiple neuroendocrine tumors. He would benefit in my opinion from evaluation through oncology. The most recent MRI of the brain without contrast did not show any obvious signs of metastatic disease but was of course limited. I do not see how he can tolerate another MRI at this point, he was quite restless and fidgety. He had 2 recent head CTs last month which showed evidence of bilateral basal ganglia infarcts. This can in turn cause secondary parkinsonism but whether or not this is his primary problem is unclear to me and I doubt it. Unfortunately there is not a whole lot I can add at this time, he is currently residing in a memory care facility, his son and daughter-in-law are advised to seek consultation with oncology, whether or not this could be in part a  paraneoplastic syndrome is also within the realm of possibilities. Should he show a acute or more rapid decline, he may have to be taken to the emergency room. I explained by findings to the patient and primarily his family. I will see the patient on an as needed basis.  I answered all their questions today and the patient's son and daughter-in-law were in agreement.  Thank you very much for allowing me to participate in the care of this nice patient. If I can be of any further assistance to you please do not hesitate to call me at [phone].  Sincerely,   Andre Age, MD, PhD

## 2018-03-20 NOTE — Patient Instructions (Signed)
You don't have classic parkinson's disease. Whether or not you have Lewy body dementia is difficult to say, the course is rather rapid even for Lewy body dementia. You may benefit from evaluation through an oncologist here.  The recent head CT in December 2019 showed evidence of bilateral strokes in the basal ganglia areas which can cause secondary parkinsonism. It is possible that you have developed some parkinsonian features because of strokes. Unfortunately, there is not a whole lot I can add at this time. I would favor of referral to oncology. Please talk to primary care provider about this.

## 2018-03-30 ENCOUNTER — Ambulatory Visit: Payer: Medicare Other | Admitting: Neurology

## 2018-04-03 ENCOUNTER — Ambulatory Visit: Payer: Medicare Other | Admitting: Oncology

## 2018-04-11 NOTE — Telephone Encounter (Signed)
Called for update on neck pain since appt was not kept with Dr T.. pt's son answered and advised me that pt passed away on 2022-05-25.   FYI to PCP

## 2018-04-23 DEATH — deceased

## 2018-05-01 ENCOUNTER — Ambulatory Visit: Payer: Self-pay | Admitting: Physician Assistant

## 2019-08-07 IMAGING — DX DG KNEE COMPLETE 4+V*L*
3 series · 3 of 3 positions shown · non-contrast
Comparison: None.

CLINICAL DATA: Osteoarthritis.

EXAM:
LEFT KNEE - COMPLETE 4+ VIEW

[knee ap]
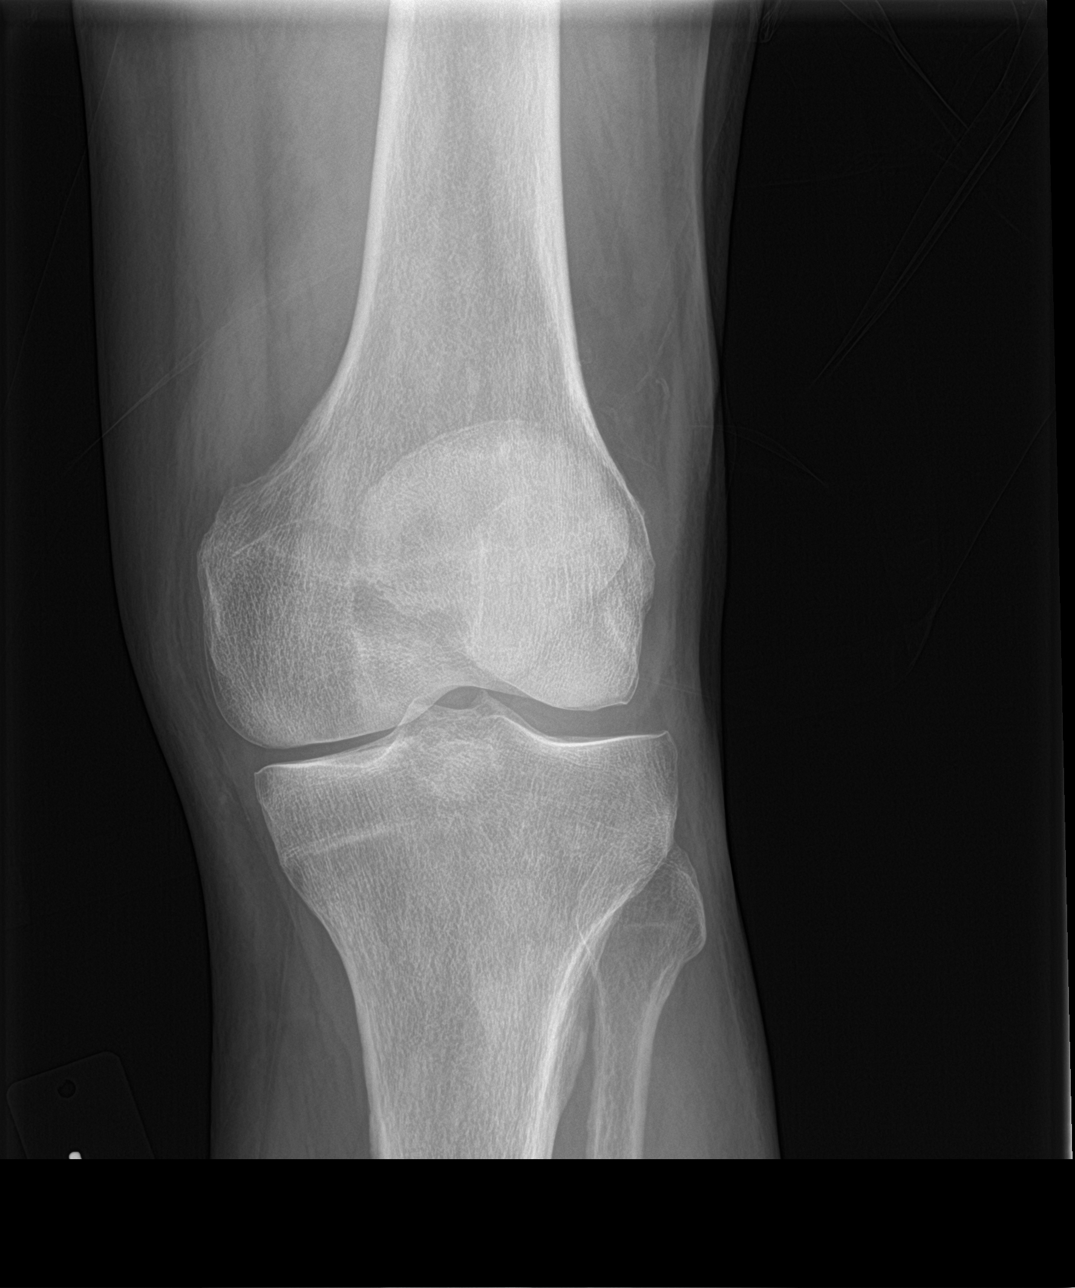

[knee obl (1 of 2)]
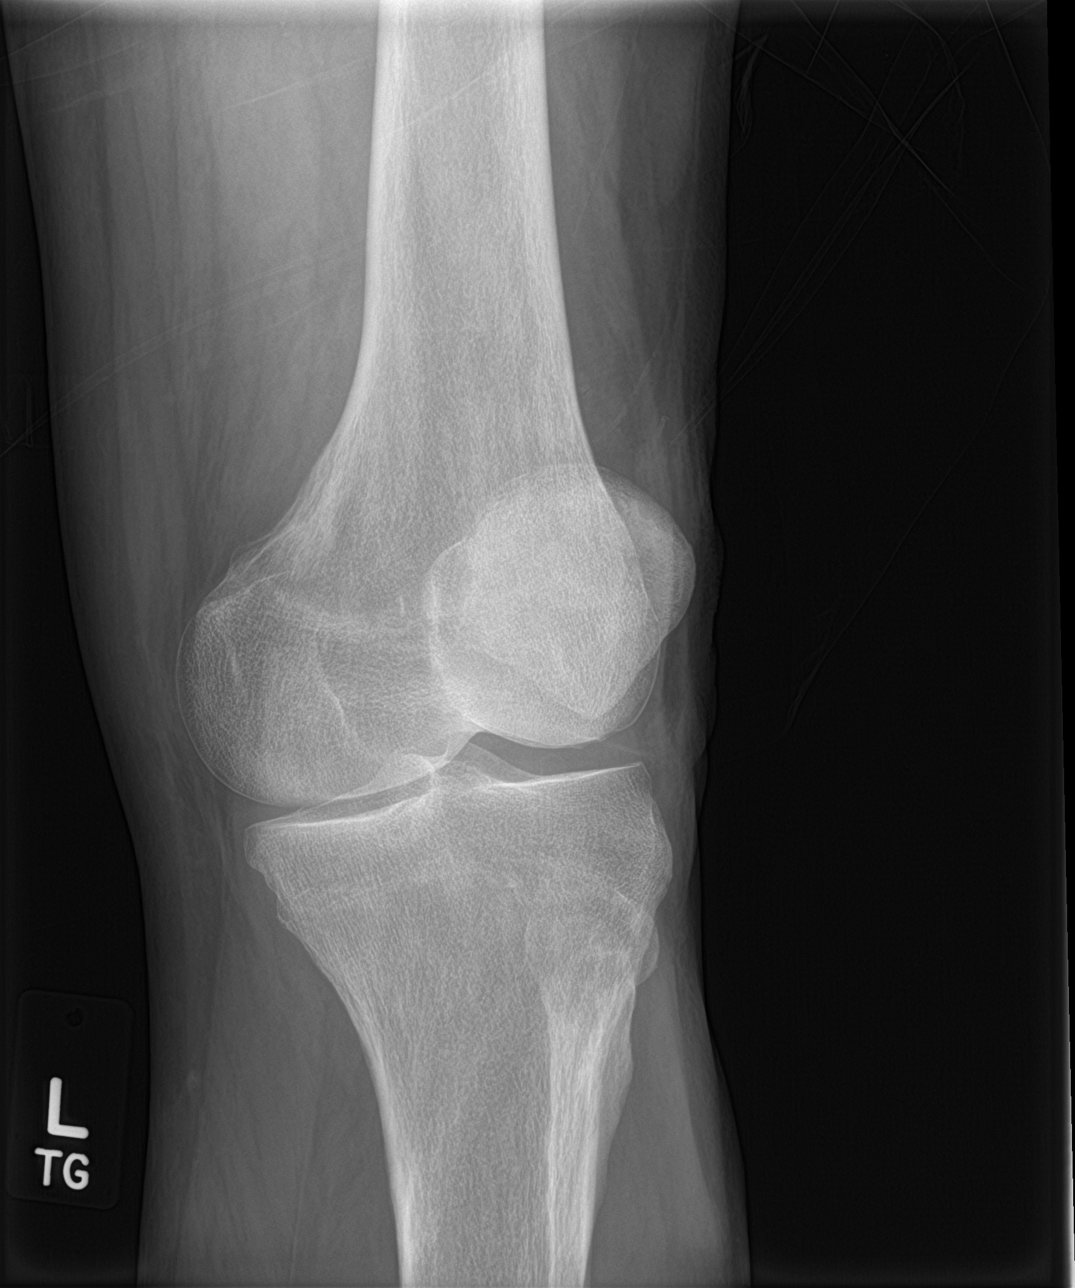

[knee obl (2 of 2)]
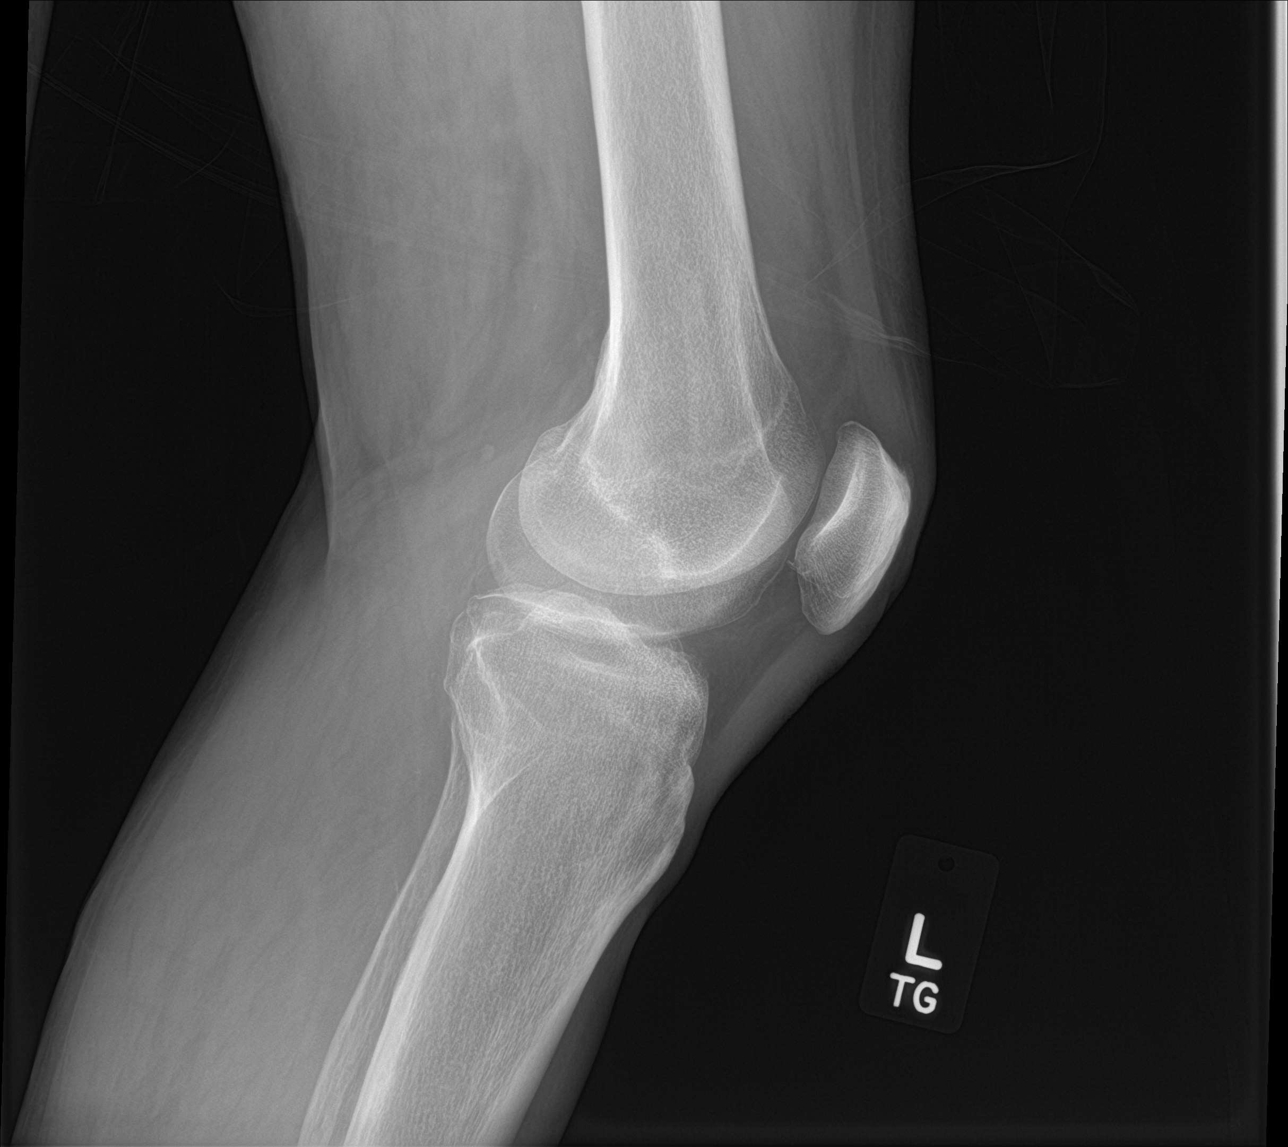

[3 of 3 positions shown; findings below may reference images not displayed]

FINDINGS: Mild joint space narrowing in the medial compartment. No acute bony
abnormality. Specifically, no fracture, subluxation, or dislocation.
No joint effusion.
IMPRESSION: Mild joint space narrowing in medial compartment. No acute bony
abnormality.
